# Patient Record
Sex: Female | Born: 1965 | Race: White | Hispanic: No | Marital: Married | Smoking: Never smoker
Health system: Southern US, Community
[De-identification: ages and names within clinical notes are randomized; demographics above are authoritative.]

---

## 1998-05-23 ENCOUNTER — Other Ambulatory Visit: Admission: RE | Admit: 1998-05-23 | Discharge: 1998-05-23 | Payer: Self-pay | Admitting: Obstetrics and Gynecology

## 1998-06-11 ENCOUNTER — Ambulatory Visit (HOSPITAL_COMMUNITY): Admission: AD | Admit: 1998-06-11 | Discharge: 1998-06-11 | Payer: Self-pay | Admitting: Obstetrics and Gynecology

## 1999-01-31 ENCOUNTER — Inpatient Hospital Stay (HOSPITAL_COMMUNITY): Admission: AD | Admit: 1999-01-31 | Discharge: 1999-01-31 | Payer: Self-pay | Admitting: Obstetrics and Gynecology

## 1999-04-10 ENCOUNTER — Inpatient Hospital Stay (HOSPITAL_COMMUNITY): Admission: AD | Admit: 1999-04-10 | Discharge: 1999-04-12 | Payer: Self-pay | Admitting: Obstetrics and Gynecology

## 1999-04-17 ENCOUNTER — Inpatient Hospital Stay (HOSPITAL_COMMUNITY): Admission: AD | Admit: 1999-04-17 | Discharge: 1999-04-17 | Payer: Self-pay | Admitting: Obstetrics & Gynecology

## 1999-04-24 ENCOUNTER — Encounter (HOSPITAL_COMMUNITY): Admission: RE | Admit: 1999-04-24 | Discharge: 1999-07-23 | Payer: Self-pay | Admitting: Obstetrics and Gynecology

## 1999-07-26 ENCOUNTER — Encounter (HOSPITAL_COMMUNITY): Admission: RE | Admit: 1999-07-26 | Discharge: 1999-10-24 | Payer: Self-pay | Admitting: Obstetrics and Gynecology

## 2001-12-27 ENCOUNTER — Other Ambulatory Visit: Admission: RE | Admit: 2001-12-27 | Discharge: 2001-12-27 | Payer: Self-pay | Admitting: Obstetrics and Gynecology

## 2001-12-28 ENCOUNTER — Other Ambulatory Visit: Admission: RE | Admit: 2001-12-28 | Discharge: 2001-12-28 | Payer: Self-pay | Admitting: Obstetrics and Gynecology

## 2002-02-03 ENCOUNTER — Inpatient Hospital Stay (HOSPITAL_COMMUNITY): Admission: RE | Admit: 2002-02-03 | Discharge: 2002-02-03 | Payer: Self-pay | Admitting: Obstetrics and Gynecology

## 2002-02-03 ENCOUNTER — Encounter: Payer: Self-pay | Admitting: Obstetrics and Gynecology

## 2002-02-25 ENCOUNTER — Ambulatory Visit (HOSPITAL_COMMUNITY): Admission: RE | Admit: 2002-02-25 | Discharge: 2002-02-25 | Payer: Self-pay | Admitting: Obstetrics and Gynecology

## 2002-02-25 ENCOUNTER — Encounter: Payer: Self-pay | Admitting: Obstetrics and Gynecology

## 2002-06-03 ENCOUNTER — Inpatient Hospital Stay (HOSPITAL_COMMUNITY): Admission: AD | Admit: 2002-06-03 | Discharge: 2002-06-03 | Payer: Self-pay | Admitting: Obstetrics and Gynecology

## 2002-07-14 ENCOUNTER — Inpatient Hospital Stay (HOSPITAL_COMMUNITY): Admission: AD | Admit: 2002-07-14 | Discharge: 2002-07-16 | Payer: Self-pay | Admitting: Obstetrics and Gynecology

## 2003-07-29 ENCOUNTER — Ambulatory Visit (HOSPITAL_COMMUNITY): Admission: RE | Admit: 2003-07-29 | Discharge: 2003-07-29 | Payer: Self-pay | Admitting: Vascular Surgery

## 2003-07-29 ENCOUNTER — Encounter (INDEPENDENT_AMBULATORY_CARE_PROVIDER_SITE_OTHER): Payer: Self-pay | Admitting: *Deleted

## 2003-12-28 ENCOUNTER — Other Ambulatory Visit: Admission: RE | Admit: 2003-12-28 | Discharge: 2003-12-28 | Payer: Self-pay | Admitting: Obstetrics and Gynecology

## 2004-07-09 ENCOUNTER — Encounter: Admission: RE | Admit: 2004-07-09 | Discharge: 2004-07-09 | Payer: Self-pay | Admitting: Obstetrics and Gynecology

## 2005-01-08 ENCOUNTER — Other Ambulatory Visit: Admission: RE | Admit: 2005-01-08 | Discharge: 2005-01-08 | Payer: Self-pay | Admitting: Obstetrics and Gynecology

## 2005-08-29 ENCOUNTER — Encounter: Admission: RE | Admit: 2005-08-29 | Discharge: 2005-08-29 | Payer: Self-pay | Admitting: Obstetrics and Gynecology

## 2006-01-14 ENCOUNTER — Other Ambulatory Visit: Admission: RE | Admit: 2006-01-14 | Discharge: 2006-01-14 | Payer: Self-pay | Admitting: Obstetrics and Gynecology

## 2006-09-08 ENCOUNTER — Encounter: Admission: RE | Admit: 2006-09-08 | Discharge: 2006-09-08 | Payer: Self-pay | Admitting: Obstetrics and Gynecology

## 2007-01-23 ENCOUNTER — Ambulatory Visit: Payer: Self-pay | Admitting: Sports Medicine

## 2007-01-23 DIAGNOSIS — IMO0002 Reserved for concepts with insufficient information to code with codable children: Secondary | ICD-10-CM | POA: Insufficient documentation

## 2007-01-23 DIAGNOSIS — M79609 Pain in unspecified limb: Secondary | ICD-10-CM | POA: Insufficient documentation

## 2007-02-02 ENCOUNTER — Encounter (INDEPENDENT_AMBULATORY_CARE_PROVIDER_SITE_OTHER): Payer: Self-pay | Admitting: *Deleted

## 2007-02-02 ENCOUNTER — Telehealth (INDEPENDENT_AMBULATORY_CARE_PROVIDER_SITE_OTHER): Payer: Self-pay | Admitting: *Deleted

## 2007-02-03 ENCOUNTER — Encounter (INDEPENDENT_AMBULATORY_CARE_PROVIDER_SITE_OTHER): Payer: Self-pay | Admitting: *Deleted

## 2007-02-03 ENCOUNTER — Encounter: Admission: RE | Admit: 2007-02-03 | Discharge: 2007-02-03 | Payer: Self-pay | Admitting: Sports Medicine

## 2007-02-03 ENCOUNTER — Telehealth (INDEPENDENT_AMBULATORY_CARE_PROVIDER_SITE_OTHER): Payer: Self-pay | Admitting: *Deleted

## 2007-02-06 ENCOUNTER — Ambulatory Visit: Payer: Self-pay | Admitting: Sports Medicine

## 2007-02-06 DIAGNOSIS — M8448XA Pathological fracture, other site, initial encounter for fracture: Secondary | ICD-10-CM | POA: Insufficient documentation

## 2007-02-06 DIAGNOSIS — M7750 Other enthesopathy of unspecified foot: Secondary | ICD-10-CM | POA: Insufficient documentation

## 2007-02-13 ENCOUNTER — Ambulatory Visit: Payer: Self-pay | Admitting: Sports Medicine

## 2007-02-19 DIAGNOSIS — D229 Melanocytic nevi, unspecified: Secondary | ICD-10-CM

## 2007-02-19 HISTORY — DX: Melanocytic nevi, unspecified: D22.9

## 2007-03-06 ENCOUNTER — Ambulatory Visit: Payer: Self-pay | Admitting: Sports Medicine

## 2007-04-14 ENCOUNTER — Ambulatory Visit: Payer: Self-pay | Admitting: Family Medicine

## 2007-05-08 ENCOUNTER — Encounter (INDEPENDENT_AMBULATORY_CARE_PROVIDER_SITE_OTHER): Payer: Self-pay | Admitting: *Deleted

## 2007-09-15 ENCOUNTER — Encounter: Admission: RE | Admit: 2007-09-15 | Discharge: 2007-09-15 | Payer: Self-pay | Admitting: Obstetrics and Gynecology

## 2008-05-22 IMAGING — CR DG FOOT COMPLETE 3+V*R*
3 series · 3 of 3 positions shown · non-contrast
Comparison: none

CLINICAL DATA: Pain.  Ran marathon last week. 

 RIGHT FOOT - 3 VIEW:

[view not recorded (1 of 3)]
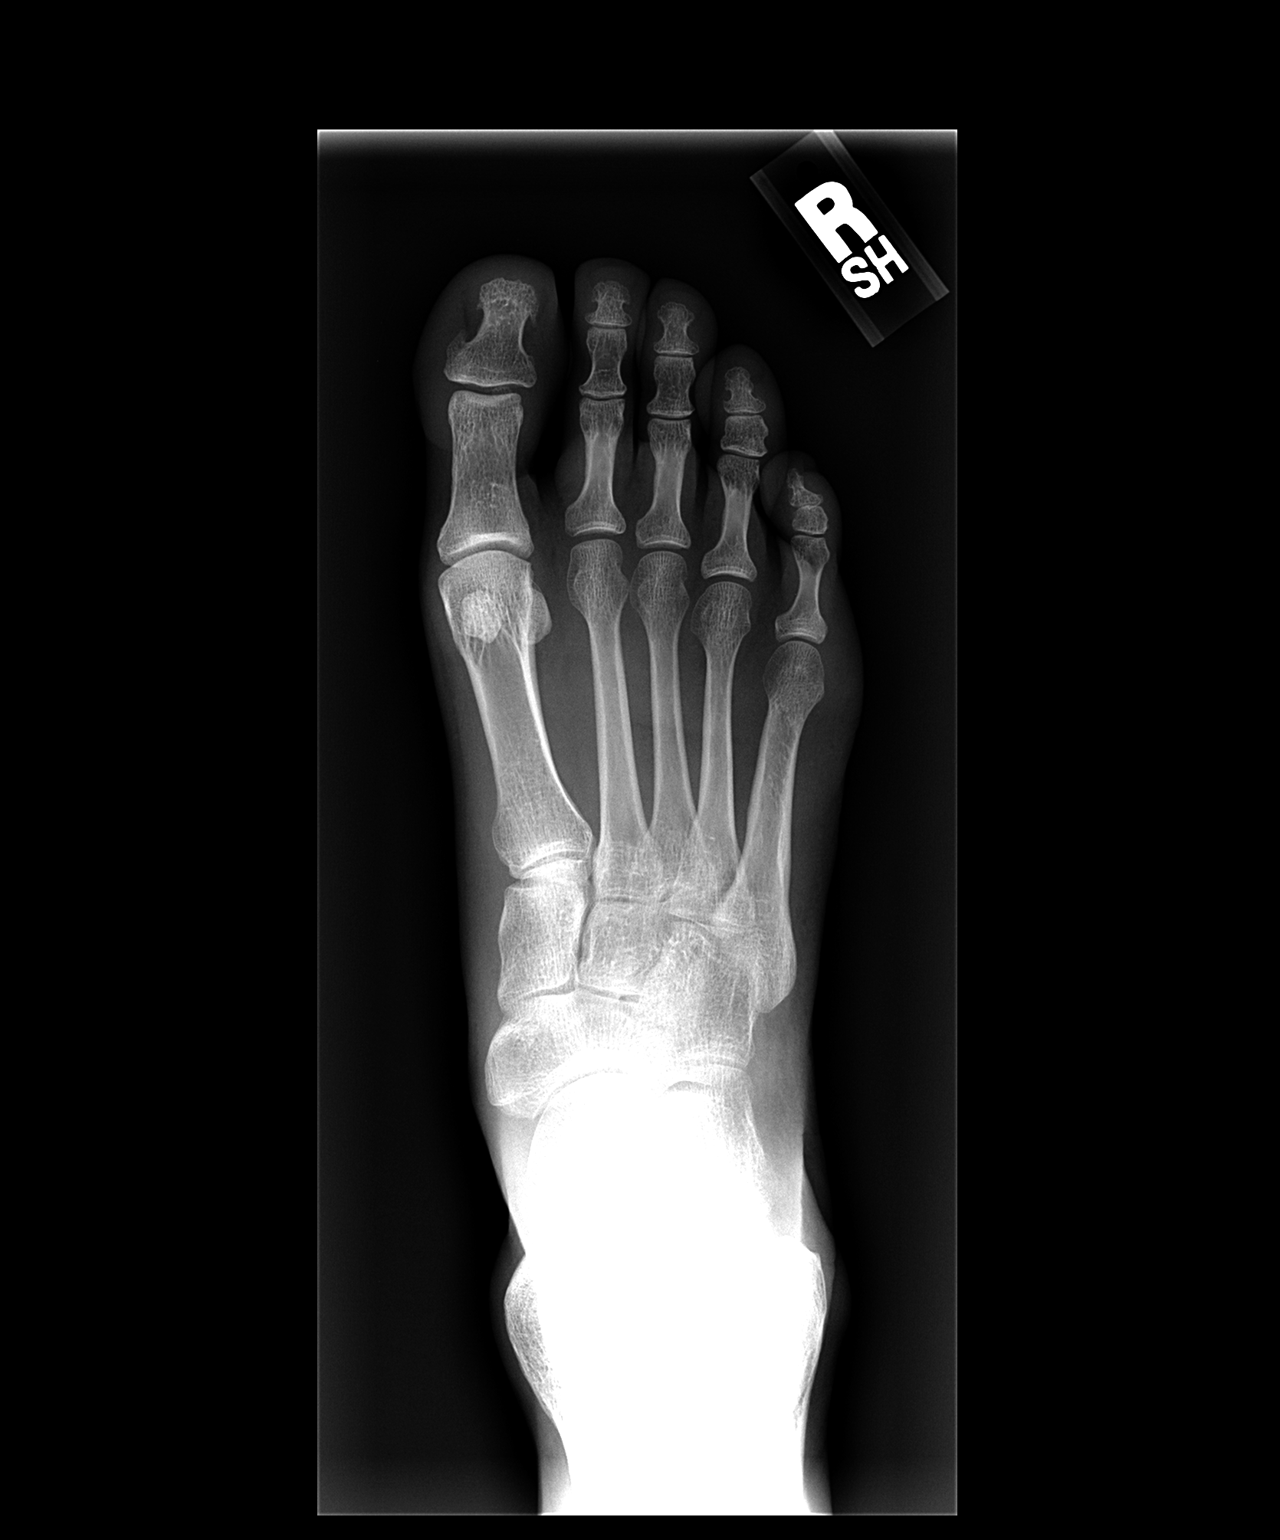

[view not recorded (2 of 3)]
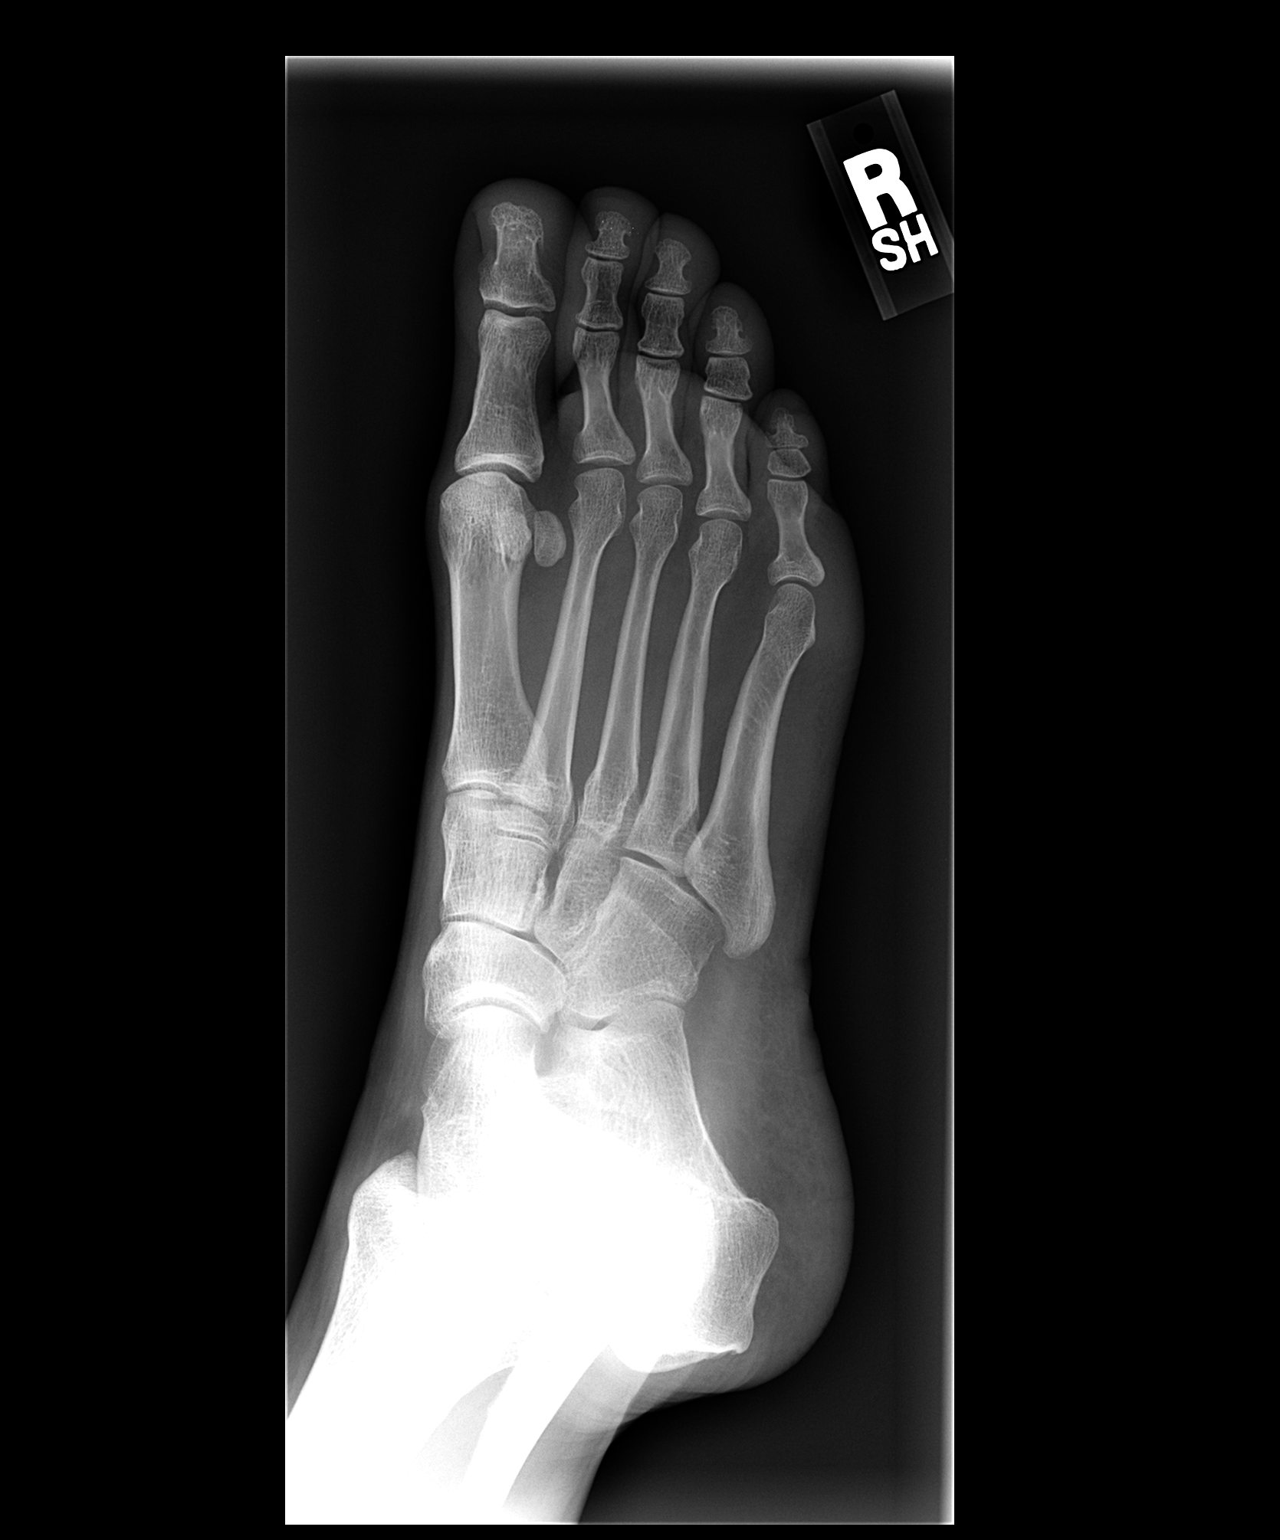

[view not recorded (3 of 3)]
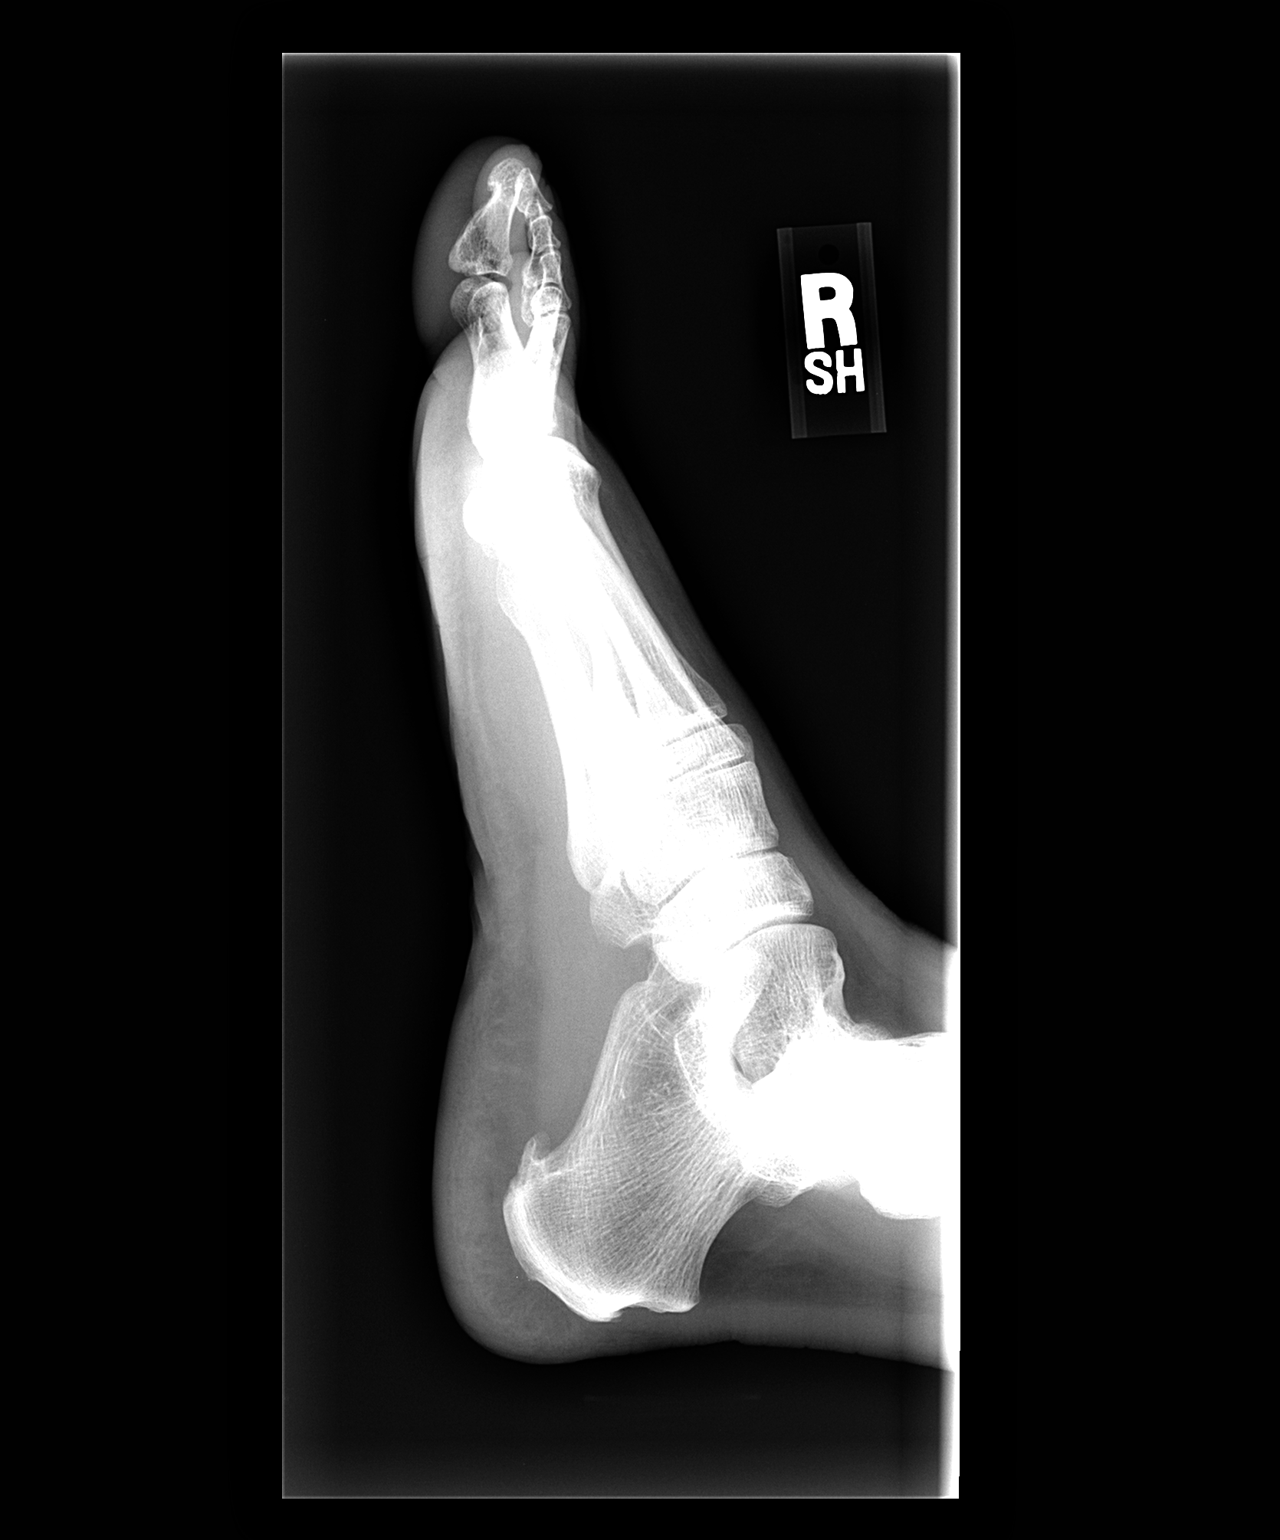

[3 of 3 positions shown; findings below may reference images not displayed]

FINDINGS: Three views of the right foot were obtained.  No fracture is seen.  No periosteal reaction is noted.  Alignment is normal.  A plantar calcaneal degenerative spur is present.
IMPRESSION: No acute abnormality.  Plantar calcaneal degenerative spur.

## 2008-05-22 IMAGING — CR DG TIBIA/FIBULA 2V*L*
2 series · 2 of 2 positions shown · non-contrast
Comparison: none

CLINICAL DATA: Ran marathon last week with pain. 
 LEFT TIBIA/FIBULA TWO VIEWS:
 No acute fracture is seen.  No periosteal reaction is noted.  Alignment is normal.

[view not recorded (1 of 2)]
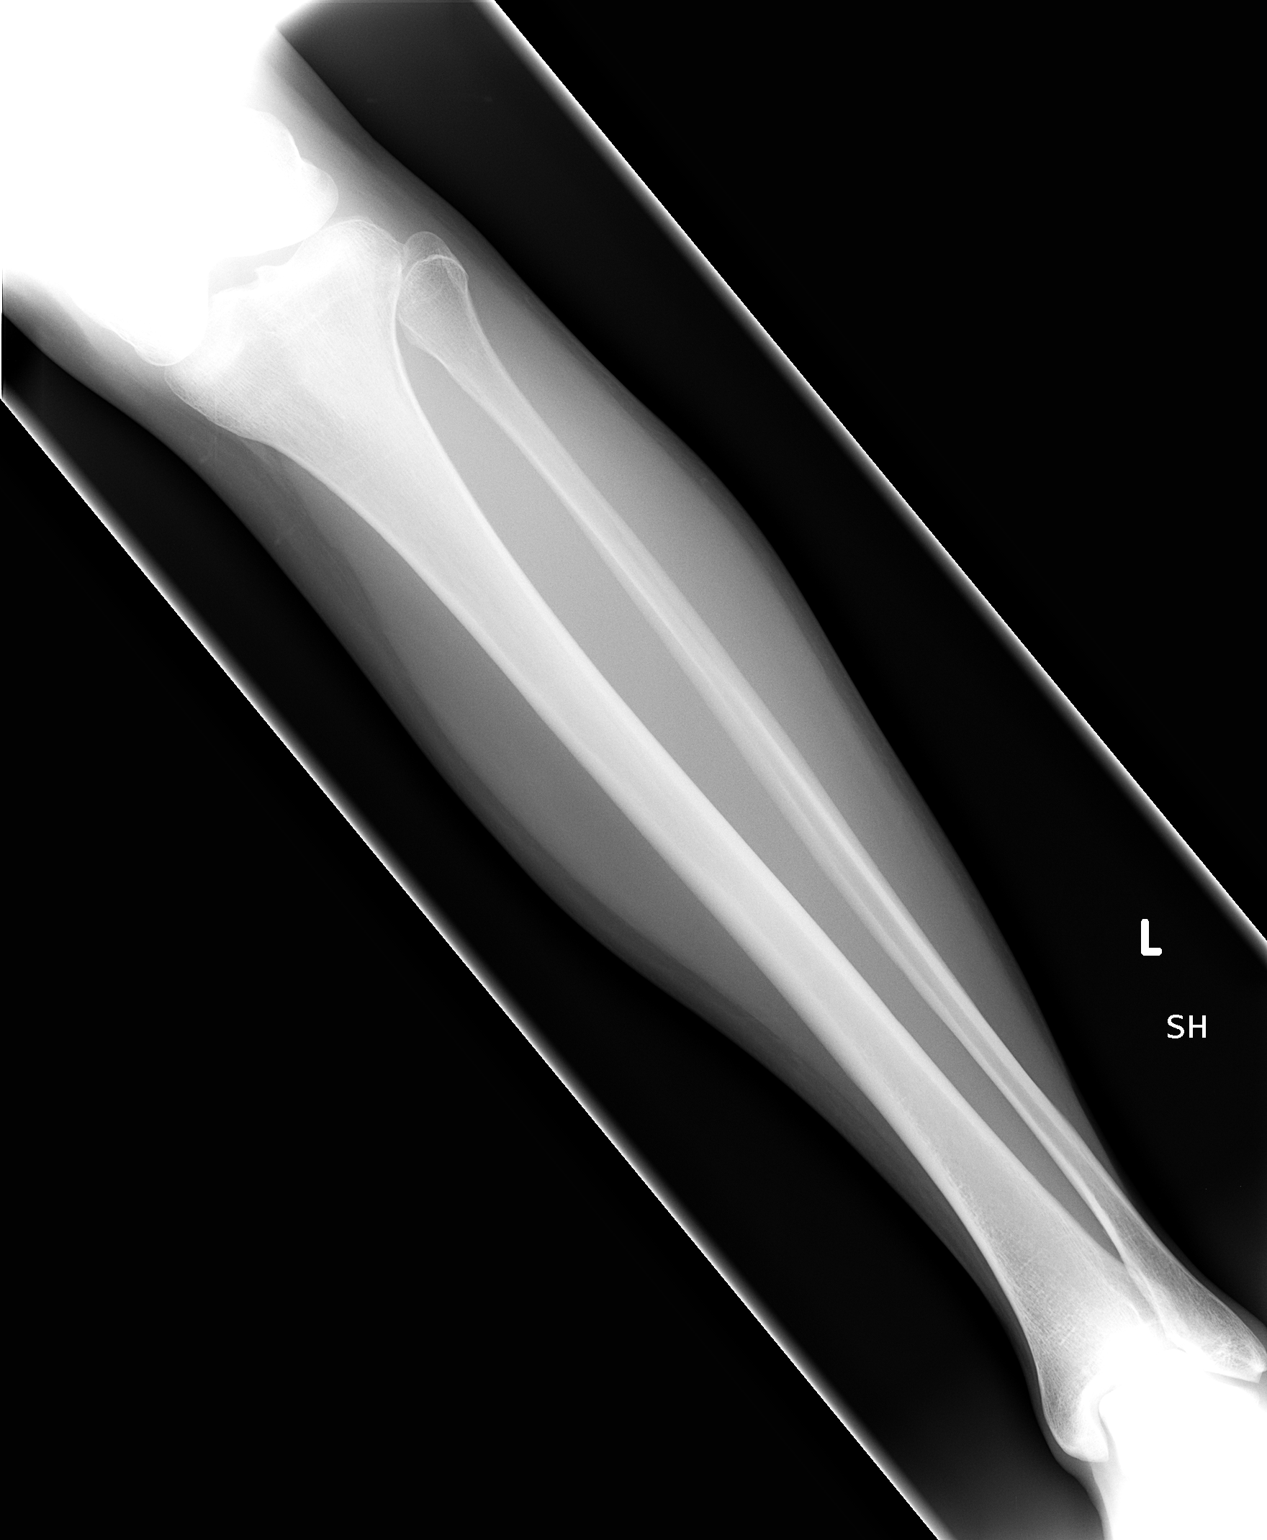

[view not recorded (2 of 2)]
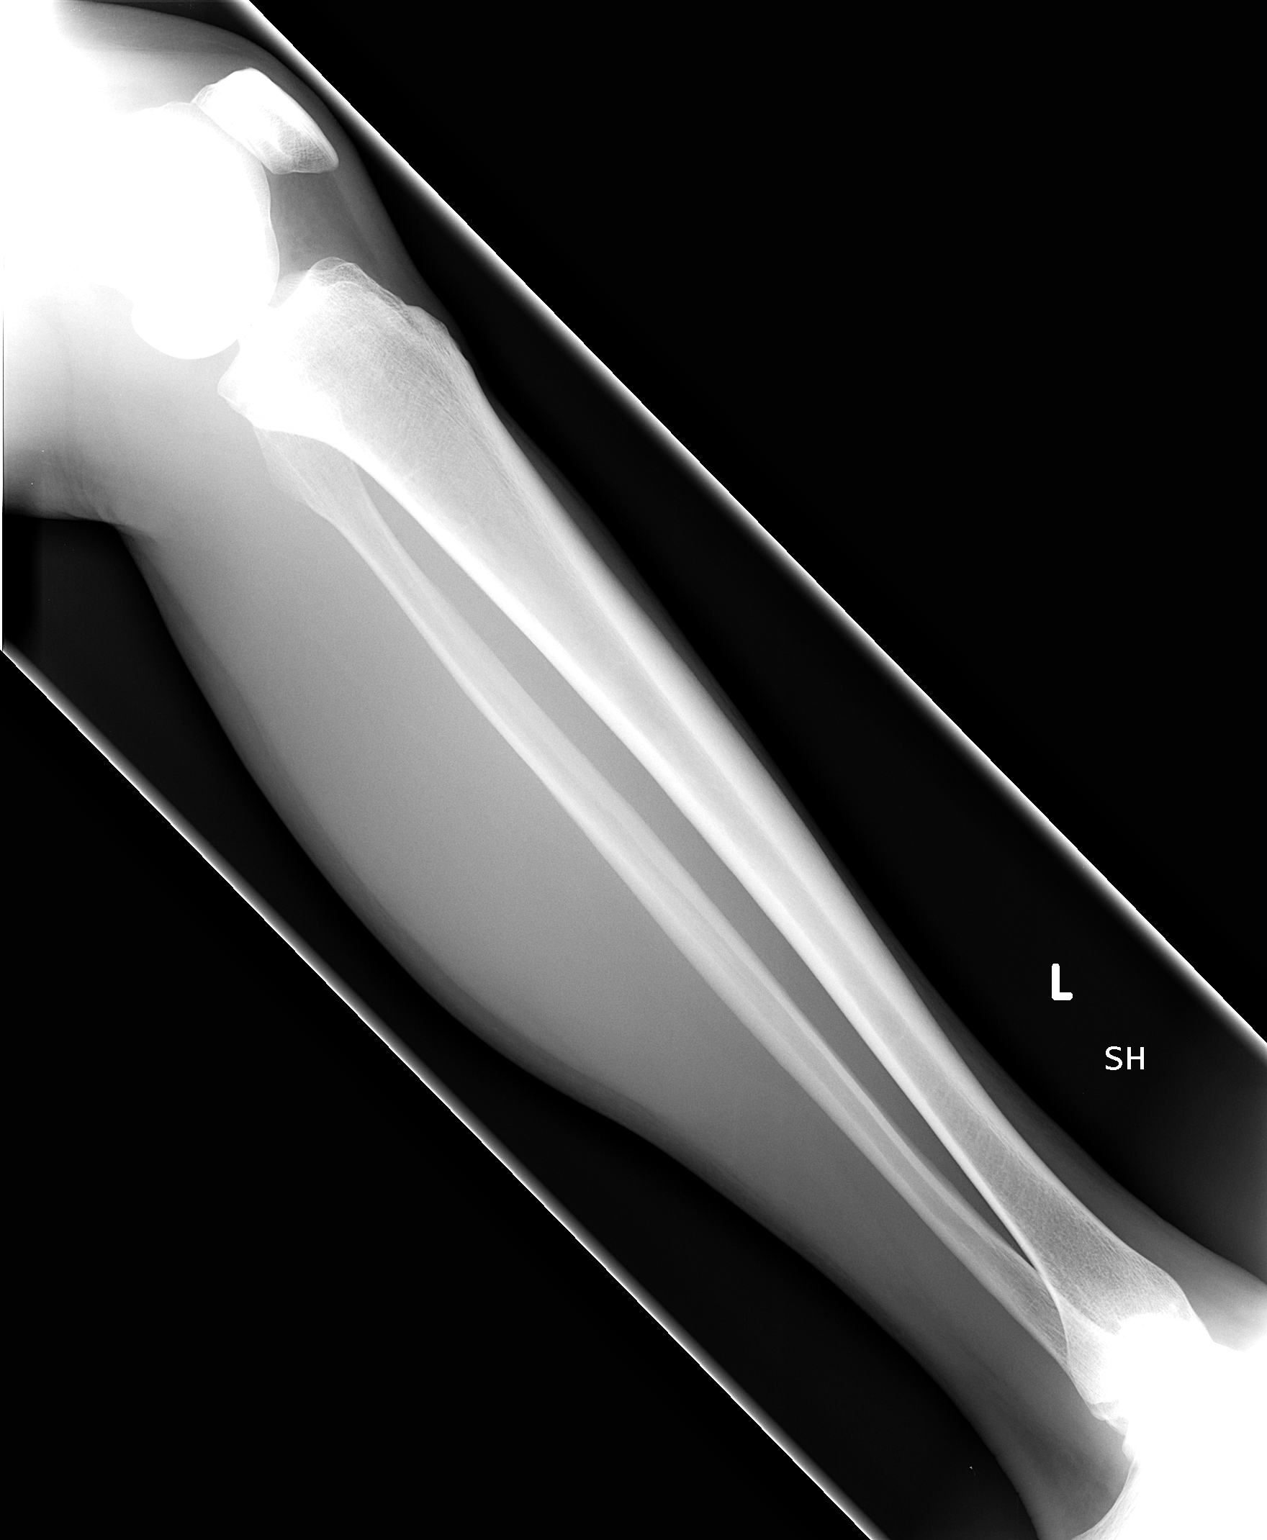

[2 of 2 positions shown; findings below may reference images not displayed]

IMPRESSION: Negative left tib/fib.

## 2008-10-06 ENCOUNTER — Encounter: Admission: RE | Admit: 2008-10-06 | Discharge: 2008-10-06 | Payer: Self-pay | Admitting: Obstetrics and Gynecology

## 2008-10-27 ENCOUNTER — Ambulatory Visit: Payer: Self-pay | Admitting: Sports Medicine

## 2008-10-27 DIAGNOSIS — M2170 Unequal limb length (acquired), unspecified site: Secondary | ICD-10-CM

## 2009-09-08 ENCOUNTER — Encounter: Admission: RE | Admit: 2009-09-08 | Discharge: 2009-09-08 | Payer: Self-pay | Admitting: Obstetrics and Gynecology

## 2010-09-13 ENCOUNTER — Encounter
Admission: RE | Admit: 2010-09-13 | Discharge: 2010-09-13 | Payer: Self-pay | Source: Home / Self Care | Attending: Obstetrics and Gynecology | Admitting: Obstetrics and Gynecology

## 2011-02-08 NOTE — Op Note (Signed)
   Victoria [name], SAKATA NO.:  [id number]   MEDICAL RECORD NO.:  [id number]                   PATIENT TYPE:  OIB   LOCATION:  2899                                 FACILITY:  [facility]   PHYSICIAN:  Quita Skye. Hart Rochester, M.D.               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   PROCEDURE:  Ligation and stripping greater saphenous vein of right leg from  ankle to distal thigh with excision of multiple varicosities right calf.   SURGEON:  Quita Skye. Hart Rochester, M.D.   ASSISTANT:  Rowe Clack, P.A.-C.   ANESTHESIA:  General endotracheal.   DESCRIPTION OF PROCEDURE:  The patient was taken to the holding area where  the varicosities were marked with the patient in the upright position.  These were involving the greater saphenous vein from the distal thigh down  to the ankle both anteriorly and posteriorly.  Following this, the patient  was taken to the operating room.  Satisfactory general endotracheal  anesthesia was administered.  The right leg was placed in the Trendelenburg  position.  After prepping and draping with Betadine scrub and solution, the  greater saphenous vein was exposed anterior to medial malleolus, ligated  distally being careful not to injure the saphenous nerve.  It was opened an  intraluminal stripper was passed proximally and palpated in the distal thigh  where a short incision was made and the vein ligated proximally and a small  stripper head secured.  The several short transverse stab wounds were made  with an 11 blade and the varicosities were used using both dissection and  avulsion techniques both in the posterior calf and in the pretibial region.  When this was completed, the leg  was wrapped with an Ace and the greater  saphenous vein stripped from proximal to distal and adequate compression  applied for 10 minutes for hemostasis.  The wounds were then closed with  Vicryl in a subcuticular fashion with Steri-Strips.  A sterile dressing was  applied consisting of 4x4, Webril, and Ace wrap. The patient was taken to  the recovery room in satisfactory condition.                                               Quita Skye Hart Rochester, M.D.    JDL/MEDQ  D:  07/29/2003  T:  07/29/2003  Job:  [id number]

## 2011-02-08 NOTE — H&P (Signed)
[name], [name] NO.:  [id number]   MEDICAL RECORD NO.:  [id number]                   PATIENT TYPE:  INP   LOCATION:  NA                                   FACILITY:  [facility]   PHYSICIAN:  Victoria [name], M.D.            DATE OF BIRTH:  Dec 02, 1965   DATE OF ADMISSION:  07/14/2002  DATE OF DISCHARGE:                                HISTORY & PHYSICAL   HISTORY OF PRESENT ILLNESS:  This is a 45 year old gravida 3, para 1-0-1-1,  at 38-2/7 weeks who presents to the office with complaints of severe  headache and scotoma consisting of seeing flashes of lights and feeling  lightheaded.  She was seen yesterday in the office by Dr. Su [name] and had  elevated blood pressure of 126/90.  PIH labs were drawn.  At that time she  had only trace protein in her urine and was scheduled to come back on  Thursday.  She returns today with a blood pressure of 140/90 with a recheck  of 150/100.  Her voided specimen was +1 protein and catheterization specimen  of urine was trace protein. Her cervix was more favorable than it was  yesterday at 2 cm, 70% effaced, and -1 station and upon lab review and  apparent worsening of her status, discussion was made with Dr. Stefano [name] to  admit to the M.D. service for elective induction of labor for impending  preeclampsia.  Her pregnancy has been followed by Dr. Stefano [name] and has been  remarkable for:  1) History of rapid labor. 2) HSV.  3) Rh negative.  Her OB  history is remarkable for a spontaneous abortion in 1997 with no  complications, a spontaneous vaginal delivery in 2000 of a female infant at  [redacted] weeks gestation weighing 7 pounds 8 ounces, remarkable for a three-hour  labor.   PRENATAL LABORATORY DATA:  Hemoglobin 13.3, platelets 290, blood type A  negative, antibody screen negative, RPR nonreactive, rubella immune,  hepatitis B surface antigen negative, HIV declined.  Glucose Challenge  within normal limits and Group B  Strep was negative.   PAST MEDICAL HISTORY:  Remarkable only for HSV with rare outbreaks.  History  of varicosities which are worse on the right leg.   PAST SURGICAL HISTORY:  Remarkable for sclerotherapy of the varicosities on  her right leg and wisdom teeth extraction.   FAMILY HISTORY:  Remarkable for a paternal grandfather with lung cancer and  alcohol use.   GENETIC HISTORY:  Unremarkable.   SOCIAL HISTORY:  The patient is married to Victoria [name] who is involved  and supportive.  She is of the [employer].  She denies any alcohol,  tobacco, or drug use.   PHYSICAL EXAMINATION:  VITAL SIGNS:  Stable, blood pressure 150/100.  HEENT:  Within normal limits with no obvious edema.  DTRs 2+ to 3+.  CHEST:  Clear to auscultation.  HEART: Regular rate and  rhythm.  ABDOMEN:  Gravid at 37 cm with slight tenderness in the right upper  quadrant.  Vertex to [place].  EFM shows a nonreactive NST with contractions  every two to six minutes and therefore a negative CST (no decelerations).  PELVIC:  2 cm, 70% effaced, and -1 station with vertex presentation.  EXTREMITIES:  Within normal limits with trace edema.   Her catheterized urinalysis shows trace protein.  Her PIH labs drawn  yesterday on July 13, 2002, were hemoglobin of 11.7, platelet count 186.  Her chem-7 was within normal limits.  Alkaline phosphatase was 662.  SGOT  20, SGPT 19, total protein 6.6, albumin 3.3, calcium 9.2, and uric acid is  not available.   ASSESSMENT:  1. Intrauterine pregnancy at 38-2/7 weeks.  2. Impending preeclampsia, pregnancy-induced hypertension.  3. Favorable cervix, questionable early labor.  4. Group B Strep negative.  5. History of rapid labor.  6. 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   PLAN:  1. Admit to birthing suite per Victoria [name], M.D.  2. Routine M.D. orders.  3. Further plan per Dr. Stefano [name].     Victoria [name], C.N.M.                  Victoria [name], M.D.    MLW/MEDQ  D:  07/14/2002  T:  07/14/2002  Job:  [id number]

## 2011-10-22 ENCOUNTER — Other Ambulatory Visit: Payer: Self-pay | Admitting: Obstetrics and Gynecology

## 2011-10-22 DIAGNOSIS — Z1231 Encounter for screening mammogram for malignant neoplasm of breast: Secondary | ICD-10-CM

## 2011-11-25 ENCOUNTER — Ambulatory Visit
Admission: RE | Admit: 2011-11-25 | Discharge: 2011-11-25 | Disposition: A | Discharge status: Home / Self Care | Payer: Self-pay | Source: Ambulatory Visit | Attending: Obstetrics and Gynecology | Admitting: Obstetrics and Gynecology

## 2011-11-25 DIAGNOSIS — Z1231 Encounter for screening mammogram for malignant neoplasm of breast: Secondary | ICD-10-CM

## 2011-11-26 ENCOUNTER — Ambulatory Visit (INDEPENDENT_AMBULATORY_CARE_PROVIDER_SITE_OTHER): Payer: Self-pay | Admitting: Obstetrics and Gynecology

## 2011-11-26 DIAGNOSIS — Z01419 Encounter for gynecological examination (general) (routine) without abnormal findings: Secondary | ICD-10-CM

## 2012-11-09 ENCOUNTER — Other Ambulatory Visit: Payer: Self-pay | Admitting: Obstetrics and Gynecology

## 2012-11-09 DIAGNOSIS — Z1231 Encounter for screening mammogram for malignant neoplasm of breast: Secondary | ICD-10-CM

## 2012-11-27 ENCOUNTER — Ambulatory Visit: Payer: Self-pay

## 2012-11-30 ENCOUNTER — Ambulatory Visit: Payer: Self-pay | Admitting: Obstetrics and Gynecology

## 2012-11-30 ENCOUNTER — Ambulatory Visit
Admission: RE | Admit: 2012-11-30 | Discharge: 2012-11-30 | Disposition: A | Discharge status: Home / Self Care | Payer: Self-pay | Source: Ambulatory Visit | Attending: Obstetrics and Gynecology | Admitting: Obstetrics and Gynecology

## 2012-11-30 ENCOUNTER — Encounter: Payer: Self-pay | Admitting: Obstetrics and Gynecology

## 2012-11-30 VITALS — BP 110/72 | Resp 16 | Ht 64.0 in | Wt 117.0 lb

## 2012-11-30 DIAGNOSIS — Z1231 Encounter for screening mammogram for malignant neoplasm of breast: Secondary | ICD-10-CM

## 2012-11-30 DIAGNOSIS — Z124 Encounter for screening for malignant neoplasm of cervix: Secondary | ICD-10-CM

## 2012-11-30 NOTE — Patient Instructions (Signed)
Perimenopause Perimenopause is the time when your body begins to move into the menopause (no menstrual period for 12 straight months). It is a natural process. Perimenopause can begin 2 to 8 years before the menopause and usually lasts for one year after the menopause. During this time, your ovaries may or may not produce an egg. The ovaries vary in their production of estrogen and progesterone hormones each month. This can cause irregular menstrual periods, difficulty in getting pregnant, vaginal bleeding between periods and uncomfortable symptoms. CAUSES  Irregular production of the ovarian hormones, estrogen and progesterone, and not ovulating every month.  Other causes include:  Tumor of the pituitary gland in the brain.  Medical disease that affects the ovaries.  Radiation treatment.  Chemotherapy.  Unknown causes.  Heavy smoking and excessive alcohol intake can bring on perimenopause sooner. SYMPTOMS   Hot flashes.  Night sweats.  Irregular menstrual periods.  Decrease sex drive.  Vaginal dryness.  Headaches.  Mood swings.  Depression.  Memory problems.  Irritability.  Tiredness.  Weight gain.  Trouble getting pregnant.  The beginning of losing bone cells (osteoporosis).  The beginning of hardening of the arteries (atherosclerosis). DIAGNOSIS  Your caregiver will make a diagnosis by analyzing your age, menstrual history and your symptoms. They will do a physical exam noting any changes in your body, especially your female organs. Female hormone tests may or may not be helpful depending on the amount and when you produce the female hormones. However, other hormone tests may be helpful (ex. thyroid hormone) to rule out other problems. TREATMENT  The decision to treat during the perimenopause should be made by you and your caregiver depending on how the symptoms are affecting you and your life style. There are various treatments available such as:  Treating  individual symptoms with a specific medication for that symptom (ex. tranquilizer for depression).  Herbal medications that can help specific symptoms.  Counseling.  Group therapy.  No treatment. HOME CARE INSTRUCTIONS   Before seeing your caregiver, make a list of your menstrual periods (when the occur, how heavy they are, how long between periods and how long they last), your symptoms and when they started.  Take the medication as recommended by your caregiver.  Sleep and rest.  Exercise.  Eat a diet that contains calcium (good for your bones) and soy (acts like estrogen hormone).  Do not smoke.  Avoid alcoholic beverages.  Taking vitamin E may help in certain cases.  Take calcium and vitamin D supplements to help prevent bone loss.  Group therapy is sometimes helpful.  Acupuncture may help in some cases. SEEK MEDICAL CARE IF:   You have any of the above and want to know if it is perimenopause.  You want advice and treatment for any of your symptoms mentioned above.  You need a referral to a specialist (gynecologist, psychiatrist or psychologist). SEEK IMMEDIATE MEDICAL CARE IF:   You have vaginal bleeding.  Your period lasts longer than 8 days.  You periods are recurring sooner than 21 days.  You have bleeding after intercourse.  You have severe depression.  You have pain when you urinate.  You have severe headaches.  You develop vision problems. Document Released: 10/17/2004 Document Revised: 12/02/2011 Document Reviewed: 07/07/2008 Exit[employer] Patient Information 2013 [employer].  

## 2012-11-30 NOTE — Progress Notes (Signed)
Patient ID: Victoria [name], female   DOB: 08/28/1966, 47 y.o.   MRN: [id number] Last Pap: 11/2011 wnl WNL: Yes Regular Periods:no Contraception: Vas  Monthly Breast exam:yes / irregularly Tetanus<83yrs:yes Nl.Bladder Function:yes Daily BMs:yes Healthy Diet:yes Calcium:yes Mammogram:yes Date of Mammogram: this morning.  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 female, No obstetric history on file., who presents for an annual exam.     History   Social History  . Marital Status: Married    Spouse Name: N/A    Number of Children: N/A  . Years of Education: N/A   Social History Main Topics  . Smoking status: Never Smoker   . Smokeless tobacco: None  . Alcohol Use: None  . Drug Use: None  . Sexually Active: None   Other Topics Concern  . None   Social History Narrative  . None    Menstrual cycle:   LMP: Patient's last menstrual period was 11/28/2012.           Cycle: Regular till 06/2012.  No further menses till current cycle which started on 11/28/12 and has been normal in amount and duration.  She noted occassional episodes of insomnia and mild night sweats during the time of amenorrhea.  None now.  Mild vaginal dryness sx. Also some sx of mood swings, but admits this may be situational as her family are expats in [place] for the last 4 years.  They will return to the [place] in July of 2015.    The following portions of the patient's history were reviewed and updated as appropriate: allergies, current medications, past family history, past medical history, past social history, past surgical history and problem list.  Review of Systems Pertinent items are noted in HPI. Breast:Negative for breast lump,nipple  discharge or nipple retraction Gastrointestinal: Negative for abdominal pain, change in bowel habits or rectal bleeding Urinary:negative   Objective:    BP 110/72  Resp 16  Ht 5\' 4"  (1.626 m)  Wt 117 lb (53.071 kg)  BMI 20.07 kg/m2  LMP 11/28/2012    Weight:  Wt Readings from Last 1 Encounters:  11/30/12 117 lb (53.071 kg)          BMI: Body mass index is 20.07 kg/(m^2).  General Appearance: Alert, appropriate appearance for age. No acute distress HEENT: Grossly normal Neck / Thyroid: Supple, no masses, nodes or enlargement Lungs: clear to auscultation bilaterally Back: No CVA tenderness Breast Exam: No masses or nodes.No dimpling, nipple retraction or discharge. Cardiovascular: Regular rate and rhythm. S1, S2, no murmur Gastrointestinal: Soft, non-tender, no masses or organomegaly Pelvic Exam: Vulva  appears normal.   The vagina is slightly atrophic.Bimanual exam reveals normal uterus and adnexa. Rectovaginal: normal rectal, no masses Lymphatic Exam: Non-palpable nodes in neck, clavicular, axillary, or inguinal regions Skin: no rash or abnormalities Neurologic: Normal gait and speech, no tremor  Psychiatric: Alert and oriented, appropriate affect.   Wet Prep:not applicable Urinalysis:not applicable UPT: Not done   Assessment:    Normal gyn exam Perimenopausal bleeding pattern  Menopausal sx   Plan:    mammogram pap smear counseled on breast self exam, mammography screening and menopause return annually or prn Contraception:vasectomy Discussed option of low dose BCPs to regulate menses and stabilize hormone sx.  Pt declines for now Encouraged maintaining her frequent visits to friends in person and electronically until [place] the [place].  Dierdre Forth MD

## 2012-12-01 LAB — PAP IG W/ RFLX HPV ASCU

## 2013-04-14 ENCOUNTER — Other Ambulatory Visit: Payer: Self-pay | Admitting: Obstetrics and Gynecology

## 2013-04-14 DIAGNOSIS — N644 Mastodynia: Secondary | ICD-10-CM

## 2013-04-15 ENCOUNTER — Ambulatory Visit
Admission: RE | Admit: 2013-04-15 | Discharge: 2013-04-15 | Disposition: A | Discharge status: Home / Self Care | Payer: 59 | Source: Ambulatory Visit | Attending: Obstetrics and Gynecology | Admitting: Obstetrics and Gynecology

## 2013-04-15 DIAGNOSIS — N644 Mastodynia: Secondary | ICD-10-CM

## 2013-04-16 ENCOUNTER — Other Ambulatory Visit: Payer: Self-pay

## 2017-07-28 ENCOUNTER — Ambulatory Visit
Admission: RE | Admit: 2017-07-28 | Discharge: 2017-07-28 | Disposition: A | Discharge status: Home / Self Care | Payer: 59 | Source: Ambulatory Visit | Attending: Sports Medicine | Admitting: Sports Medicine

## 2017-07-28 ENCOUNTER — Encounter: Payer: Self-pay | Admitting: Sports Medicine

## 2017-07-28 ENCOUNTER — Other Ambulatory Visit: Payer: Self-pay | Admitting: Sports Medicine

## 2017-07-28 ENCOUNTER — Ambulatory Visit (INDEPENDENT_AMBULATORY_CARE_PROVIDER_SITE_OTHER): Payer: 59 | Admitting: Sports Medicine

## 2017-07-28 VITALS — BP 102/68 | Ht 64.0 in | Wt 120.0 lb

## 2017-07-28 DIAGNOSIS — M7120 Synovial cyst of popliteal space [Baker], unspecified knee: Secondary | ICD-10-CM

## 2017-07-28 DIAGNOSIS — M25561 Pain in right knee: Secondary | ICD-10-CM

## 2017-07-28 NOTE — Progress Notes (Signed)
   Subjective:    Patient ID: Victoria [name], female    DOB: 13-Feb-1966, 51 y.o.   MRN: [id number]  HPI chief complaint: Right knee pain  Very pleasant 51 year old female comes in today complaining of posterior right knee pain that started in July. She denies any trauma but rather describes an intermittent discomfort in the back of her knee. She is very active and teaches exercise classes. For the most part, she is able to teach her classes without any discomfort but most of her pain occurs when powerwalking. It is most noticeable with the knee in full extension. She has noticed some swelling in the back of the knee as well. She has also recently noticed more diffuse pain along the sides of her knees. She denies any stiffness. No feelings of instability. No prior knee surgery on the right knee but she did have a left knee meniscectomy several years ago. She takes an occasional ibuprofen which is helpful. No pain at rest. No recent imaging.  Past medical history is reviewed Medications are reviewed Allergies are reviewed    Review of Systems    as above Objective:   Physical Exam  Well-developed, well-nourished. Fit-appearing. No acute distress.  Right knee: Full range of motion. No obvious effusion. Good quad strength. 1-2+ patellofemoral crepitus. There is a slight amount of fullness in the popliteal fossa, most noticeable with the knee in extension. Slight tenderness to palpation along the medial joint line with an equivocal Thessaly's. Negative McMurray's. Good joint stability. Neurovascularly intact distally. Walking without a limp.  [facility] ultrasound of the right knee was performed. Limited images were obtained. There is a small joint effusion and spurring along the medial joint line. No obvious meniscal tear. She does have a small Baker's cyst in the posterior knee.  X-rays of the right knee show minimal degenerative changes      Assessment & Plan:   Right knee Baker's  cyst  Compression sleeve with activity. I discussed possibility of cortisone injection in the future but the cyst is too small to aspirate today. She does not want to proceed with cortisone injection anyway. She will modify her activity as needed but may continue with activity using pain as her guide. Follow-up as needed.

## 2017-09-12 ENCOUNTER — Encounter: Payer: Self-pay | Admitting: Family Medicine

## 2017-09-12 ENCOUNTER — Ambulatory Visit (INDEPENDENT_AMBULATORY_CARE_PROVIDER_SITE_OTHER): Payer: 59 | Admitting: Family Medicine

## 2017-09-12 VITALS — BP 119/67 | Ht 64.0 in | Wt 123.0 lb

## 2017-09-12 DIAGNOSIS — M25562 Pain in left knee: Secondary | ICD-10-CM

## 2017-09-12 NOTE — Progress Notes (Signed)
[facility]: Attending Note: I have reviewed the chart, discussed wit the Sports Medicine Fellow. I agree with assessment and treatment plan as detailed in the [place] note. Left knee pain which is subacute.  Like she has had some problems with it in the past but symptoms resolved until about 3 or 4 weeks ago.  No specific injury.  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  Sometimes these bother her.  Ultrasound showed some fluid in the joint most notable at the meniscus laterally.  She has some osteophytes consistent with mild to moderate knee OA.  With her history of prior meniscectomy, I would caution her against doing squats at all.  If she did them on a squat machine, I would only utilize 50% of the range of motion neither fully extending nor fully flexing.  Additionally, knee extension but should not be fully locked out.  I explained this to her in detail using a knee model and I think she understands.  She plans to modify her workouts both for her and her clients.  She wants to handle this conservatively and I think if she is cautious and decreases some of these activities that are aggravating it, she may have improvement.  If not, I would probably consider corticosteroid injection as the next step.  We discussed this in detail spending greater than 50% of our 25-minute office visit in counseling and education.  I will see her back as needed.

## 2017-09-12 NOTE — Progress Notes (Signed)
Chief complaint: Left knee pain 1 month  History of present illness: Victoria [name] is a 51 year old female who presents to the sports medicine office today with chief complaint of left knee pain. She reports specifically having pain in the medial aspect of her left knee. She reports that symptoms have been ongoing for the last month. She does not report of any specific inciting incident, trauma, or injury to explain the pain. 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 He reports having pain with deep squats, box jumps, and some lunges. She does not report of any mechanical symptoms include popping, locking, catching, or symptoms or giving way. She describes the pain as a throbbing and aching pain. She reports when doing Lotus stretches having pain along the medial joint line. No swelling, warmth, erythema, ecchymosis or effusion. She does have history of medial meniscectomy back in 2015 done in [place]. She does not report of any complications from that procedure. She reports that she does have Body Helix sleeve that was used on her right knee when she was here about a month ago for right knee pain. She reports using and on occasion for her left knee. She does not report of any numbness, tingling, or burning paresthesias. Today, she rates the pain as a 3/10. No fevers, chills, or night sweats.  Review of systems:  As stated above  Interval past medical history, surgical history, family history, and social history obtained and unchanged.  Physical exam: Vital signs are reviewed and are documented in the chart Gen.: Alert, oriented, appears stated age, in no apparent distress HEENT: Moist oral mucosa Respiratory: Normal respirations, able to speak in full sentences Cardiac: Regular rate, distal pulses 2+ Integumentary: No rashes on visible skin:  Neurologic: Strength 5/5, sensation 2+ in bilateral lower extremities Psych: Normal affect, mood is described as  good Musculoskeletal: Inspection of left knee reveals no obvious deformity or muscle atrophy, no warmth, erythema, ecchymosis, or effusion, she is tender to palpation directly over the medial joint line of the left knee, no tenderness of her quadriceps tendon, patellar tendon, or lateral joint line, Lachman, anterior drawer, valgus, varus stress testing negative, McMurray does reproduce some pain when stressing the medial joint line, no gait abnormality, she does have leg length discrepancy with left leg being about a centimeter shorter than the right leg, he does have full range of motion of the left knee, range of motion from 0 to 130  Limited musculoskeletal ultrasound was performed in the office today of her left knee. She findings from the ultrasound revealed that she does have some hypoechoic echogenicity changes and geyser sign noted along the medial joint line consistent with meniscal pathology. MCL appears intact. She does have normal physiologic effusion noted and suprapatellar pouch, normal quadriceps tendon.  Assessment and plan: 1. Acute medial left knee pain, suspect aggravation of pre-existing osteoarthritis, in setting of history of left partial medial meniscectomy back in 2015  Plan: Discussed options with [name] today to include activity modification, oral anti-inflammatory medications, or cortisone injection to the left knee. She is not interested in cortisone injection today. Specifically with activity modification, discussed no squats or box jumps. Essentially discussed avoiding extremes of knee flexion and knee extension. Discussed the use anti-inflammatory medication as needed for pain. Discussed cryotherapy. Discussed to have her use Body Helix for her left knee with activites. If she wants a second set to use for her right knee as well, discussed a have her come back and we  will get this set up for her We'll see her back in about 4-6 weeks if she is not any better. Otherwise,  we'll have her follow-up on as-needed basis.   Mort Sawyers, M.D. [place]

## 2018-10-26 DIAGNOSIS — D239 Other benign neoplasm of skin, unspecified: Secondary | ICD-10-CM

## 2019-05-26 ENCOUNTER — Other Ambulatory Visit: Payer: Self-pay

## 2019-05-26 ENCOUNTER — Encounter: Payer: Self-pay | Admitting: Family Medicine

## 2019-05-26 ENCOUNTER — Ambulatory Visit (INDEPENDENT_AMBULATORY_CARE_PROVIDER_SITE_OTHER): Payer: 59 | Admitting: Family Medicine

## 2019-05-26 ENCOUNTER — Ambulatory Visit: Payer: Self-pay

## 2019-05-26 VITALS — BP 98/62 | Wt 121.0 lb

## 2019-05-26 DIAGNOSIS — M722 Plantar fascial fibromatosis: Secondary | ICD-10-CM

## 2019-05-26 DIAGNOSIS — M25572 Pain in left ankle and joints of left foot: Secondary | ICD-10-CM | POA: Diagnosis not present

## 2019-05-26 DIAGNOSIS — M84375A Stress fracture, left foot, initial encounter for fracture: Secondary | ICD-10-CM | POA: Diagnosis not present

## 2019-05-26 NOTE — Patient Instructions (Addendum)
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  The icing of the foot will help this. Please follow-up with [place] in 2 weeks

## 2019-05-26 NOTE — Progress Notes (Signed)
[facility] [address] [place], [place] [address] Phone: [phone] Fax: [phone]   Patient Name: Victoria [name] Date of Birth: [birth year] Medical Record Number: [id number] Gender: female Date of Encounter: 05/26/2019  CC: Left foot pain  HPI: Pt is here c/o left foot pain. Pain started 7 days ago. 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  She is also powerwalking at least 30 miles a week and has been during the pandemic.  Aggravating factors include full weightbearing and plantar flexing while riding the bike. Alleviating factors include ice and ibuprofen. No radiating symptoms.  Endorses a history of stress fractures in her left foot, but does not recall exactly where.  She uses a heel lift in her left shoe because when she was younger she had a growth plate injury that has caused a mechemical leg length discrepancy.  She has a history of plantar fasciitis in this foot.  No past medical history on file.  No current outpatient medications on file prior to visit.   No current facility-administered medications on file prior to visit.     L Knee Surgery  No Known Allergies  Social History   Socioeconomic History  . Marital status: Married    Spouse name: Not on file  . Number of children: Not on file  . Years of education: Not on file  . Highest education level: Not on file  Occupational History  . Not on file  Social Needs  . Financial resource strain: Not on file  . Food insecurity    Worry: Not on file    Inability: Not on file  . Transportation needs    Medical: Not on file    Non-medical: Not on file  Tobacco Use  . Smoking status: Never Smoker  Substance and Sexual Activity  . Alcohol use: Not on file  . Drug use: Not on file  . Sexual activity: Not on file  Lifestyle  . Physical activity    Days per week: Not on file    Minutes per  session: Not on file  . Stress: Not on file  Relationships  . Social Herbalist on phone: Not on file    Gets together: Not on file    Attends religious service: Not on file    Active member of club or organization: Not on file    Attends meetings of clubs or organizations: Not on file    Relationship status: Not on file  . Intimate partner violence    Fear of current or ex partner: Not on file    Emotionally abused: Not on file    Physically abused: Not on file    Forced sexual activity: Not on file  Other Topics Concern  . Not on file  Social History Narrative  . Not on file    No family history on file.  BP 98/62   Wt 121 lb (54.9 kg)   BMI 20.77 kg/m   ROS:  See HPI CONST: no F/C, no malaise, no fatigue MSK: See above NEURO: no numbness/tingling, no weakness SKIN: no rash, no lesions HEME: no bleeding, no bruising, no erythema  Objective: Left foot: No swelling or erythema Full range of motion at ankle and toes Strength 5/5 at ankle and toes No pain with AROM TTP at medial tubercle of calcaneus Negative calcaneal squeeze test No tenderness at base of fifth metatarsal  TTP at distal second and third metatarsal Negative Mulder sign Sensation intact bilaterally NVI  Right foot: No swelling or erythema Sensation intact Full range of motion at ankle and toes Strength 5/5 at ankle and toes No TTP NVI  Limited MSK ultrasound: Left foot Medial calcaneal tuberosity visualized in long and short axis without irregularity. Origin of plantar fascia identified with no disruption in plantar fascia fibers or thickness noted. Cortical irregularity with neovascularization identified at distal third metatarsal in long and short axis  Impression: Acute stress fracture of third metatarsal  Assessment and Plan:  1.  Stress fracture of third metatarsal  Patient was fitted with carbon fiber shoe insert for the left shoe today.  She is to stop her cycling  class for the next 2 weeks.  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  She is to ice the foot daily and can use Tylenol for pain relief.  She will follow-up in 2 weeks.  2.  Plantar fasciitis of left foot  Given the stress fracture above, we will hold off on stretching and strengthening exercises for relief of plantar fasciitis at this time.  She will be icing the foot which will help with this pain.     Lanier Clam, DO, [facility] Sports Medicine Fellow

## 2019-06-09 ENCOUNTER — Ambulatory Visit (INDEPENDENT_AMBULATORY_CARE_PROVIDER_SITE_OTHER): Payer: 59 | Admitting: Family Medicine

## 2019-06-09 ENCOUNTER — Other Ambulatory Visit: Payer: Self-pay

## 2019-06-09 VITALS — BP 108/80 | Ht 64.5 in | Wt 121.0 lb

## 2019-06-09 DIAGNOSIS — M25572 Pain in left ankle and joints of left foot: Secondary | ICD-10-CM

## 2019-06-09 NOTE — Patient Instructions (Signed)
You're doing great! Continue with the carbon fiber inserts for 2 more weeks then you can stop these but continue to wear shoes that have good arch support. Icing, tylenol only if needed at this point. Continue with your current routine for 2 more weeks - I wouldn't recommend trying spin, ramping up for 2 weeks. Starting in 2 weeks (if tolerated) start the eccentric exercises for your plantar fasciitis. Follow up with me in 6 weeks for reevaluation.

## 2019-06-09 NOTE — Progress Notes (Signed)
PCP: Lavone Orn, MD  Subjective:   HPI: Patient is a 53 y.o. female here for f/u for Left foot pain.  States pain over 3rd metatarsal area is improving. Denies worsening plantar fascia pain however not getting better. Only doing elliptical and flat footed body pump classes. Using carbon fiber insert daily. Still reports tenderness with standing flat footed without support on metatarsal. Pt states she can elicit pain with unsupported foot dorsal flexion. Denies swelling, skin changes over area, loss of sensation or numbness. Not using OTC or rx meds for pain. Has iced intermittently prn.    No past medical history on file.  No current outpatient medications on file prior to visit.   No current facility-administered medications on file prior to visit.     No past surgical history on file.  No Known Allergies  Social History   Socioeconomic History  . Marital status: Married    Spouse name: Not on file  . Number of children: Not on file  . Years of education: Not on file  . Highest education level: Not on file  Occupational History  . Not on file  Social Needs  . Financial resource strain: Not on file  . Food insecurity    Worry: Not on file    Inability: Not on file  . Transportation needs    Medical: Not on file    Non-medical: Not on file  Tobacco Use  . Smoking status: Never Smoker  Substance and Sexual Activity  . Alcohol use: Not on file  . Drug use: Not on file  . Sexual activity: Not on file  Lifestyle  . Physical activity    Days per week: Not on file    Minutes per session: Not on file  . Stress: Not on file  Relationships  . Social Herbalist on phone: Not on file    Gets together: Not on file    Attends religious service: Not on file    Active member of club or organization: Not on file    Attends meetings of clubs or organizations: Not on file    Relationship status: Not on file  . Intimate partner violence    Fear of current or ex  partner: Not on file    Emotionally abused: Not on file    Physically abused: Not on file    Forced sexual activity: Not on file  Other Topics Concern  . Not on file  Social History Narrative  . Not on file    No family history on file.  BP 108/80   Ht 5' 4.5" (1.638 m)   Wt 121 lb (54.9 kg)   BMI 20.45 kg/m   Review of Systems: See HPI above.     Objective:  Physical Exam:  Gen: NAD, comfortable in exam room Pulm: no increased resp effort CA: Cap refill <2 sec  Foot Left: no deformity or swelling or ecchymosis noted, Full AROM, sensation intact L4/5 S1/2 5/5 strength with plantar and dorsoflexion 5/5, mild TTP over shaft of 3rd metatarsal, no tenderness of medial calcaneus, toe box squeeze neg.  NVI distally.  Limited US of 3rd metatarsal shows well healing callus on distal dorsal surface of bone, minimal neovascularity and edema overlying this.   Assessment & Plan:  1. Continue with carbon fiber insert for 2-4 weeks and advance activity based on pain and tolerance gradually to include spin classes or power walking 2. Continue heel wedge use 3. Continue tylenol and RICE  prn 4. Start eccentric exercise for Plantar fasciitis as tolerated starting in 2 weeks. 5. F/u in 6 weeks

## 2019-06-10 ENCOUNTER — Encounter: Payer: Self-pay | Admitting: Family Medicine

## 2020-08-21 ENCOUNTER — Ambulatory Visit (INDEPENDENT_AMBULATORY_CARE_PROVIDER_SITE_OTHER): Payer: 59 | Admitting: Dermatology

## 2020-08-21 ENCOUNTER — Other Ambulatory Visit: Payer: Self-pay

## 2020-08-21 ENCOUNTER — Encounter: Payer: Self-pay | Admitting: Dermatology

## 2020-08-21 DIAGNOSIS — Z1283 Encounter for screening for malignant neoplasm of skin: Secondary | ICD-10-CM | POA: Diagnosis not present

## 2020-08-21 DIAGNOSIS — R21 Rash and other nonspecific skin eruption: Secondary | ICD-10-CM

## 2020-08-21 MED ORDER — BETAMETHASONE DIPROPIONATE 0.05 % EX CREA: TOPICAL_CREAM | Freq: Two times a day (BID) | CUTANEOUS | 3 refills | Status: AC | PRN

## 2020-08-22 NOTE — Progress Notes (Signed)
   Follow-Up Visit   Subjective  Victoria [name] is a 54 y.o. female who presents for the following: Annual Exam (right chest- brown spot, right leg- brown spot).  General skin check. Location:  Duration:  Quality:  Associated Signs/Symptoms: Modifying Factors:  Severity:  Timing: Context:   Objective  Well appearing patient in no apparent distress; mood and affect are within normal limits.  A full examination was performed including scalp, head, eyes, ears, nose, lips, neck, chest, axillae, abdomen, back, buttocks, bilateral upper extremities, bilateral lower extremities, hands, feet, fingers, toes, fingernails, and toenails. All findings within normal limits unless otherwise noted below.   Assessment & Plan    Rash and other nonspecific skin eruption Left Lower Leg - Anterior  Please initially look for a nonprescription antiitch cream containing the ingredient pramoxine and apply this at least once daily after bathing to the itchy spots or the spots that you are prone to pick.  If there is no improvement in 3 to 4 weeks then you will pick up a prescription that is ready been sent in which is used exactly the same way for 3 to 4 weeks but should not be used on the face or body folds.  Please contact the office in 6 to 8 weeks with a follow-up status report.  Skin exam for malignant neoplasm Mid Back  Self exam examine skin twice annually, dermatologist examination yearly.     I, Lavonna Monarch, MD, have reviewed all documentation for this visit.  The documentation on 08/22/20 for the exam, diagnosis, procedures, and orders are all accurate and complete.

## 2021-02-26 ENCOUNTER — Other Ambulatory Visit: Payer: Self-pay

## 2021-02-26 ENCOUNTER — Ambulatory Visit (INDEPENDENT_AMBULATORY_CARE_PROVIDER_SITE_OTHER): Payer: Managed Care, Other (non HMO) | Admitting: Sports Medicine

## 2021-02-26 VITALS — BP 121/71 | Ht 64.5 in | Wt 122.0 lb

## 2021-02-26 DIAGNOSIS — M79675 Pain in left toe(s): Secondary | ICD-10-CM

## 2021-02-26 NOTE — Patient Instructions (Signed)
It was a pleasure seeing you today!  We are concerned that the pain in your toe is possibly a stress fracture. You will go tomorrow to get an Xray to evaluate for this but chances are that we will need to get an MRI to get a better look. You can schedule the MRI at [facility] for next week.   You should wear comfortable shoes with good padding to help relieve pain. You can take Tylenol or Ibuprofen as needed.   Thank You!

## 2021-02-26 NOTE — Progress Notes (Addendum)
  Victoria [name] - 55 y.o. female MRN [id number]  Date of birth: November 21, 1965  SUBJECTIVE:    CC: Left Big Toe Pain  [name] presents with complaints of left big toe pain for two months. She did not acutely injure the toe. 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 The pain is located at the bottom of her big toe just distal to the 1st MTP. She has had stress fractures in the past. She reports her L leg is shorter than her right and wears a heel lift. She has been wearing a metatarsal pad without much relief. 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 She does not take any medication for the pain. She does take Vit D and calcium.  Objective:  VS: BP:121/71  HR: bpm  TEMP: ( )  RESP:   HT:5' 4.5" (163.8 cm)   WT:122 lb (55.3 kg)  BMI:20.63 PHYSICAL EXAM:  Well appearing woman in NAD. A&Ox3.  Left Great Toe: No effusion or eryhtema.  TTP just distal to the first MTP. Nontender over the sesmoid.  FROM with pain on plantar flexion.  Ligaments intact.   ASSESSMENT & PLAN:  Left Toe Pain: Her pain is suspicious for a stress fracture of one of the bones of her great toe. There is some concern for sesmoid stress fracture. We will get an xray for initial evaluation but will have her schedule an MRI as she will most likely need this for further evaluate.  Marcelino Duster, MS4   Patient seen and evaluated with the medical student.  I agree with the above plan of care.  There is concern about a new stress fracture in the left great toe.  Patient has a history of stress fractures in the left leg including metatarsal and tibial stress fractures.  Patient feels like her current pain is similar in nature to what she experienced with those other injuries.  We will check an x-ray but if it is unremarkable we will need to proceed with an MRI specifically to rule out a new stress fracture in the left great toe.   Phone follow-up with those results when available.  We will delineate definitive treatment based on those findings.  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  Otherwise unremarkable.  Proceed with MRI as scheduled to rule out stress fracture.

## 2021-02-27 ENCOUNTER — Encounter: Payer: Self-pay | Admitting: Sports Medicine

## 2021-02-27 ENCOUNTER — Ambulatory Visit
Admission: RE | Admit: 2021-02-27 | Discharge: 2021-02-27 | Disposition: A | Discharge status: Home / Self Care | Payer: Managed Care, Other (non HMO) | Source: Ambulatory Visit | Attending: Sports Medicine | Admitting: Sports Medicine

## 2021-02-27 DIAGNOSIS — M79675 Pain in left toe(s): Secondary | ICD-10-CM

## 2021-03-05 ENCOUNTER — Other Ambulatory Visit: Payer: Managed Care, Other (non HMO)

## 2021-03-06 ENCOUNTER — Ambulatory Visit
Admission: RE | Admit: 2021-03-06 | Discharge: 2021-03-06 | Disposition: A | Discharge status: Home / Self Care | Payer: Managed Care, Other (non HMO) | Source: Ambulatory Visit | Attending: Sports Medicine | Admitting: Sports Medicine

## 2021-03-06 ENCOUNTER — Other Ambulatory Visit: Payer: Self-pay

## 2021-03-06 DIAGNOSIS — M79675 Pain in left toe(s): Secondary | ICD-10-CM

## 2021-03-08 ENCOUNTER — Telehealth: Payer: Self-pay | Admitting: Sports Medicine

## 2021-03-08 NOTE — Telephone Encounter (Signed)
I spoke with Victoria [name] on the phone today after reviewing the MRI of her left great toe.  There is no evidence of stress fracture.  It is an unremarkable exam.  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  She is reassured of these findings and she can continue with activity as tolerated.  Follow-up as needed.

## 2022-06-03 ENCOUNTER — Ambulatory Visit (INDEPENDENT_AMBULATORY_CARE_PROVIDER_SITE_OTHER): Payer: BC Managed Care – PPO | Admitting: Family Medicine

## 2022-06-03 ENCOUNTER — Encounter: Payer: Self-pay | Admitting: Family Medicine

## 2022-06-03 VITALS — BP 112/62 | Ht 64.5 in | Wt 120.0 lb

## 2022-06-03 DIAGNOSIS — M79661 Pain in right lower leg: Secondary | ICD-10-CM | POA: Diagnosis not present

## 2022-06-03 NOTE — Progress Notes (Unsigned)
  Victoria [name] - 56 y.o. female MRN [id number]  Date of birth: [birth year]    CHIEF COMPLAINT:       SUBJECTIVE:   HPI:  Teaches group exercise. Right calf pain feels hot and achy and warm and comes and goes. Pain started within the last week. Doesn't notice it if she's moving, but when at rest she's more aware of it. No balance issues or weakness, not taking any pain medicine for. Bothering her 2-3 times a day. Has happened sporadically years ago.   ROS:     See HPI  PERTINENT  PMH / PSH FH / / SH:  Past Medical, Surgical, Social, and Family History Reviewed & Updated in the EMR.  Pertinent findings include:    OBJECTIVE: BP 112/62 (BP Location: Left Arm, Patient Position: Sitting)   Ht 5' 4.5" (1.638 m)   Wt 120 lb (54.4 kg)   BMI 20.28 kg/m   Physical Exam:  Vital signs are reviewed.  GEN: Alert and oriented, NAD Pulm: Breathing unlabored PSY: normal mood, congruent affect  MSK: Lower Extremity: Symmetric calves w/o erythema or swelling, mild TTP in medial gastroc proximally . Good ROM at hip and ankle and normal strength, normal gait, patient neurovascularly intact.  Negative thompsons.  Limited MSK u/s right calf:  No defects noted within musculature of medial or lateral gastroc.  No baker's cyst.  Venous structures compressible in calf and popliteal fossa.  ASSESSMENT & PLAN:  1. Right Calf Pain Patient comes in for concern of DVT given hx of varicose vein removal, pain and warmth in right calf. Patient with sporadic ache in RLE 2-3x a day w/o swelling or warmth. No thrombus noted on [place] or RLE. Patient symptoms and hx and ultrasound less concerning for DVT. Patient experiencing intermittent calf pain, likely 2/2 grade 1 strain. Patient w/o functional impairment at this time, will recommend f/u as needed -F/u PRN    Holley Bouche, MD PGY-2, [facility] Resident [place]

## 2022-11-28 ENCOUNTER — Other Ambulatory Visit: Payer: Self-pay | Admitting: Internal Medicine

## 2022-11-28 DIAGNOSIS — E2839 Other primary ovarian failure: Secondary | ICD-10-CM

## 2023-01-15 ENCOUNTER — Ambulatory Visit (INDEPENDENT_AMBULATORY_CARE_PROVIDER_SITE_OTHER): Payer: BC Managed Care – PPO | Admitting: Family Medicine

## 2023-01-15 VITALS — BP 108/72 | Ht 64.5 in | Wt 122.0 lb

## 2023-01-15 DIAGNOSIS — M722 Plantar fascial fibromatosis: Secondary | ICD-10-CM

## 2023-01-15 NOTE — Patient Instructions (Signed)
You have plantar fasciitis Take tylenol and/or aleve as needed for pain  Plantar fascia stretch for 20-30 seconds (do 3 of these) in morning Lowering/raise on a step exercises 3 x 10 once or twice a day - this is very important for long term recovery. Ice heel for 15 minutes as needed. Avoid flat shoes/barefoot walking as much as possible. Arch straps have been shown to help with pain. Inserts are important (green insoles with scaphoid pads). Steroid injection is a consideration for short term pain relief if you are struggling. Consider strassburg sock when sleeping. Physical therapy is also an option. Follow up with me in 6 weeks.

## 2023-01-16 ENCOUNTER — Encounter: Payer: Self-pay | Admitting: Family Medicine

## 2023-01-16 NOTE — Progress Notes (Signed)
PCP: Kirby Funk, MD  Subjective:   HPI: Patient is a 57 y.o. female here for bilateral foot pain.  Patient reports she's had foot pain off and on for several years though recently pain worse. Left foot worse than right. Pain is plantar. Doing plantar fascia stretches. Thinks this started after she was power walking 5-6 miles for 5-6 days in a row which is not something she normally does. No injury or trauma. No swelling or bruising.  Past Medical History:  Diagnosis Date   Atypical mole 02/19/2007   right upper back moderate to severe tx-wider shave    Current Outpatient Medications on File Prior to Visit  Medication Sig Dispense Refill   betamethasone dipropionate 0.05 % cream Apply topically 2 (two) times daily as needed (Rash). 45 g 3   No current facility-administered medications on file prior to visit.    History reviewed. No pertinent surgical history.  No Known Allergies  BP 108/72   Ht 5' 4.5" (1.638 m)   Wt 122 lb (55.3 kg)   BMI 20.62 kg/m       No data to display              No data to display              Objective:  Physical Exam:  Gen: NAD, comfortable in exam room  Bilateral feet: Mild loss of long arch.  No other gross deformity, swelling, ecchymoses FROM ankles without pain.  Normal strength TTP medial calcaneus at plantar fascia insertion. Negative calcaneal squeeze. Thompsons test negative. NV intact distally.   Assessment & Plan:  1. Bilateral foot pain - 2/2 plantar fasciitis.  Home exercises and stretches reviewed.  Sports insoles with scaphoid pads.  Icing, arch binders.  Consider strassburg sock, physical therapy, injection if not improving.  F/u in 6 weeks.

## 2023-06-11 ENCOUNTER — Other Ambulatory Visit: Payer: BC Managed Care – PPO

## 2023-12-03 ENCOUNTER — Other Ambulatory Visit (HOSPITAL_COMMUNITY): Payer: Self-pay | Admitting: Internal Medicine

## 2023-12-03 DIAGNOSIS — E7800 Pure hypercholesterolemia, unspecified: Secondary | ICD-10-CM

## 2023-12-16 ENCOUNTER — Ambulatory Visit (HOSPITAL_COMMUNITY)
Admission: RE | Admit: 2023-12-16 | Discharge: 2023-12-16 | Disposition: A | Discharge status: Home / Self Care | Payer: Self-pay | Source: Ambulatory Visit | Attending: Internal Medicine | Admitting: Internal Medicine

## 2023-12-16 DIAGNOSIS — E7800 Pure hypercholesterolemia, unspecified: Secondary | ICD-10-CM | POA: Insufficient documentation

## 2024-03-10 ENCOUNTER — Other Ambulatory Visit: Payer: Self-pay | Admitting: Obstetrics and Gynecology

## 2024-03-10 DIAGNOSIS — Z1231 Encounter for screening mammogram for malignant neoplasm of breast: Secondary | ICD-10-CM

## 2024-05-25 ENCOUNTER — Ambulatory Visit
Admission: RE | Admit: 2024-05-25 | Discharge: 2024-05-25 | Disposition: A | Discharge status: Home / Self Care | Payer: Self-pay | Source: Ambulatory Visit | Attending: Obstetrics and Gynecology | Admitting: Obstetrics and Gynecology

## 2024-05-25 DIAGNOSIS — Z1231 Encounter for screening mammogram for malignant neoplasm of breast: Secondary | ICD-10-CM
